# Patient Record
Sex: Male | Born: 2000 | Race: White | Hispanic: Yes | Marital: Single | Smoking: Never smoker
Health system: Southern US, Community
[De-identification: ages and names within clinical notes are randomized; demographics above are authoritative.]

## PROBLEM LIST (undated history)

## (undated) DIAGNOSIS — S022XXA Fracture of nasal bones, initial encounter for closed fracture: Secondary | ICD-10-CM

## (undated) HISTORY — PX: URETER REVISION: SHX493

## (undated) HISTORY — PX: TONSILLECTOMY: SHX5217

---
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
## 2022-01-14 DIAGNOSIS — Z419 Encounter for procedure for purposes other than remedying health state, unspecified: Secondary | ICD-10-CM | POA: Diagnosis not present

## 2022-02-13 DIAGNOSIS — Z419 Encounter for procedure for purposes other than remedying health state, unspecified: Secondary | ICD-10-CM | POA: Diagnosis not present

## 2022-03-16 DIAGNOSIS — Z419 Encounter for procedure for purposes other than remedying health state, unspecified: Secondary | ICD-10-CM | POA: Diagnosis not present

## 2022-04-15 DIAGNOSIS — Z419 Encounter for procedure for purposes other than remedying health state, unspecified: Secondary | ICD-10-CM | POA: Diagnosis not present

## 2022-05-16 DIAGNOSIS — Z419 Encounter for procedure for purposes other than remedying health state, unspecified: Secondary | ICD-10-CM | POA: Diagnosis not present

## 2022-06-16 DIAGNOSIS — Z419 Encounter for procedure for purposes other than remedying health state, unspecified: Secondary | ICD-10-CM | POA: Diagnosis not present

## 2022-07-16 DIAGNOSIS — Z419 Encounter for procedure for purposes other than remedying health state, unspecified: Secondary | ICD-10-CM | POA: Diagnosis not present

## 2022-08-16 DIAGNOSIS — Z419 Encounter for procedure for purposes other than remedying health state, unspecified: Secondary | ICD-10-CM | POA: Diagnosis not present

## 2022-09-15 DIAGNOSIS — Z419 Encounter for procedure for purposes other than remedying health state, unspecified: Secondary | ICD-10-CM | POA: Diagnosis not present

## 2022-10-16 DIAGNOSIS — Z419 Encounter for procedure for purposes other than remedying health state, unspecified: Secondary | ICD-10-CM | POA: Diagnosis not present

## 2022-11-16 DIAGNOSIS — Z419 Encounter for procedure for purposes other than remedying health state, unspecified: Secondary | ICD-10-CM | POA: Diagnosis not present

## 2022-12-15 DIAGNOSIS — Z419 Encounter for procedure for purposes other than remedying health state, unspecified: Secondary | ICD-10-CM | POA: Diagnosis not present

## 2023-01-02 IMAGING — CT CT MAXILLOFACIAL W/O CM
3 of 6 series · 16 of 47 positions shown, 19 images · non-contrast
Comparison: None.

CLINICAL DATA: Persistent headache after being struck in face with
soccer ball

EXAM:
CT HEAD WITHOUT CONTRAST
CT MAXILLOFACIAL WITHOUT CONTRAST
TECHNIQUE: Multidetector CT imaging of the head and maxillofacial structures
were performed using the standard protocol without intravenous
contrast. Multiplanar CT image reconstructions of the maxillofacial
structures were also generated.

[Series 3: maxilllofacial 2.0 hr40 3 · axial · 0.34mm/px · z∈[-266,-124]mm · 11 of 83 slices shown, 14 images]
[im 6/83  brain]
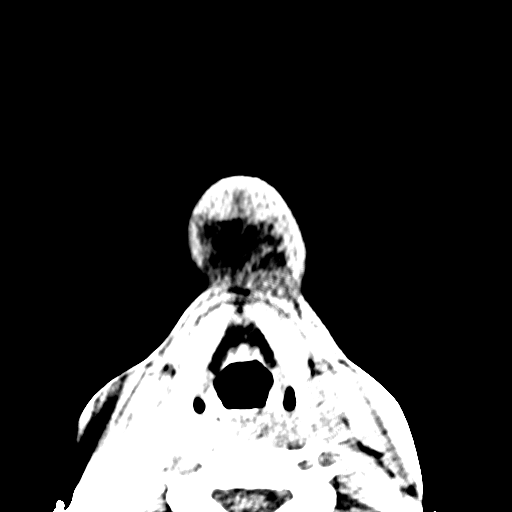
[im 6/83  bone]
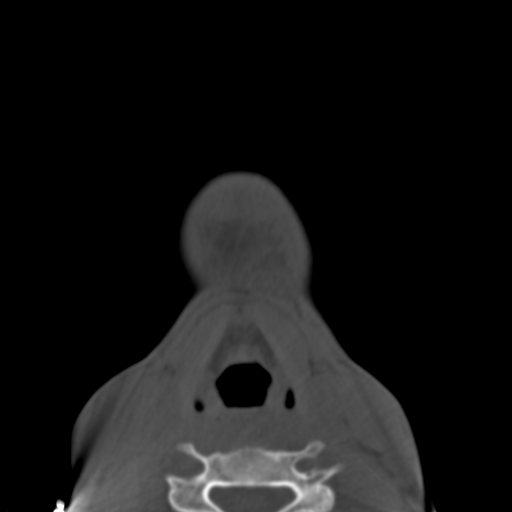
[im 12/83  bone]
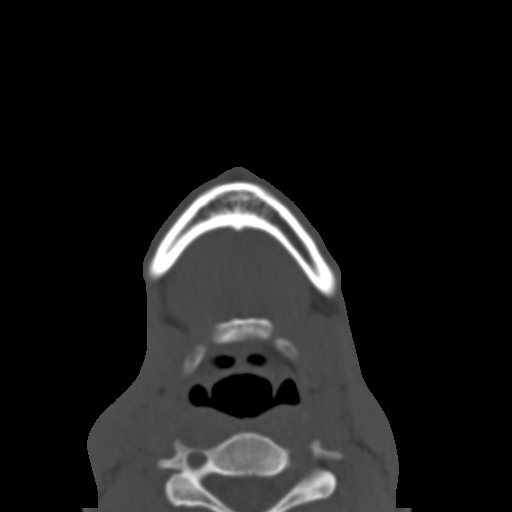
[im 18/83  bone]
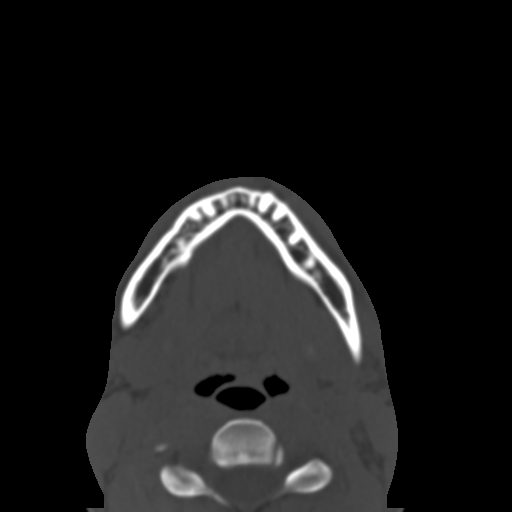
[im 30/83  bone]
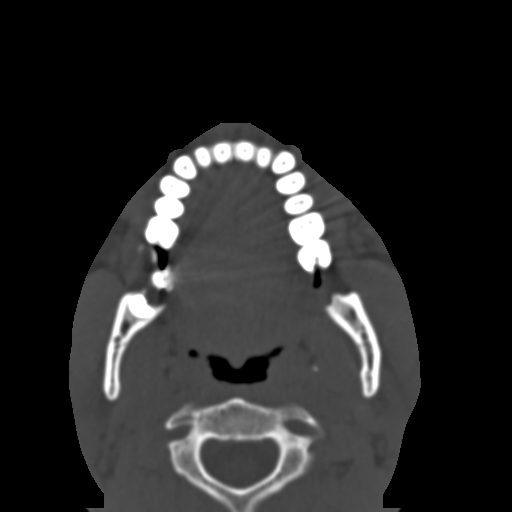
[im 36/83  brain]
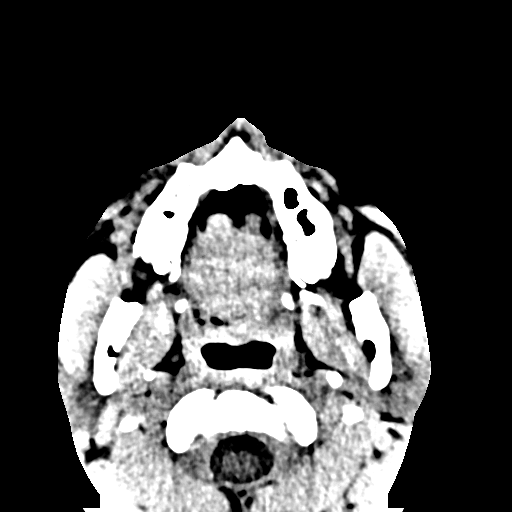
[im 36/83  bone]
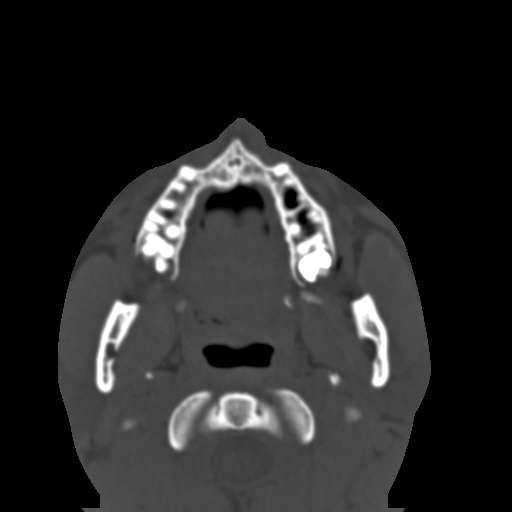
[im 42/83  bone]
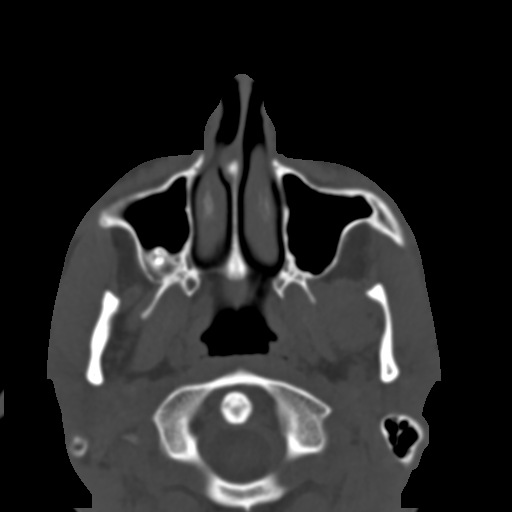
[im 47/83  bone]
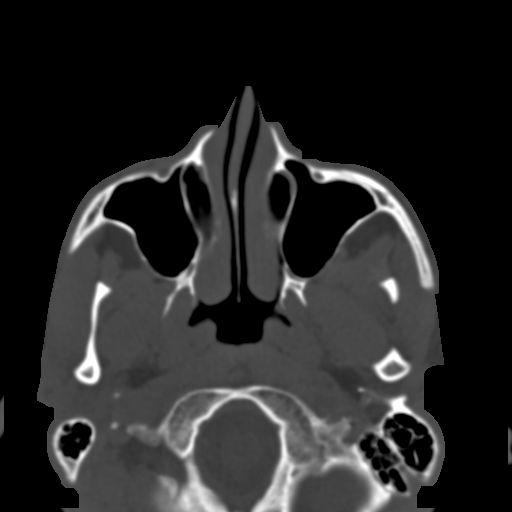
[im 53/83  bone]
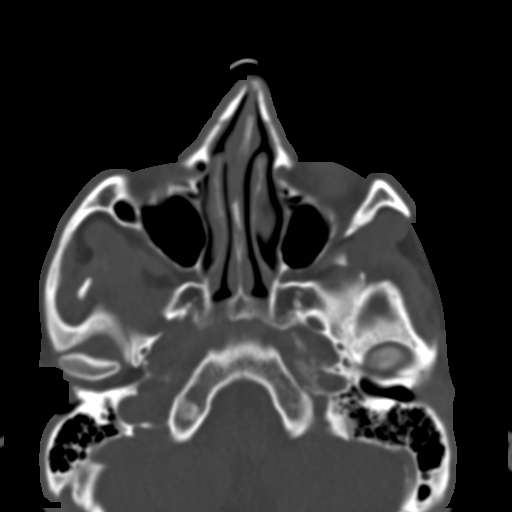
[im 65/83  brain]
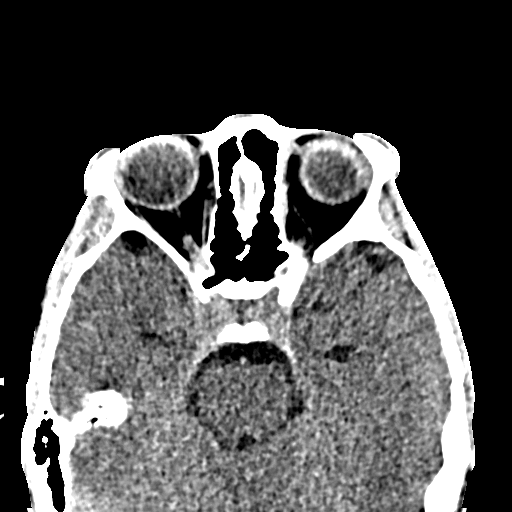
[im 65/83  bone]
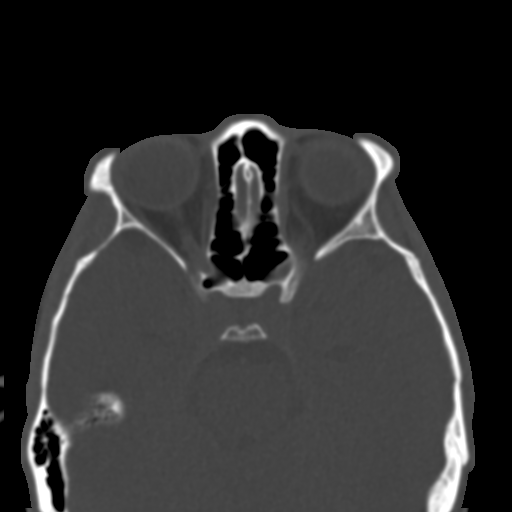
[im 71/83  bone]
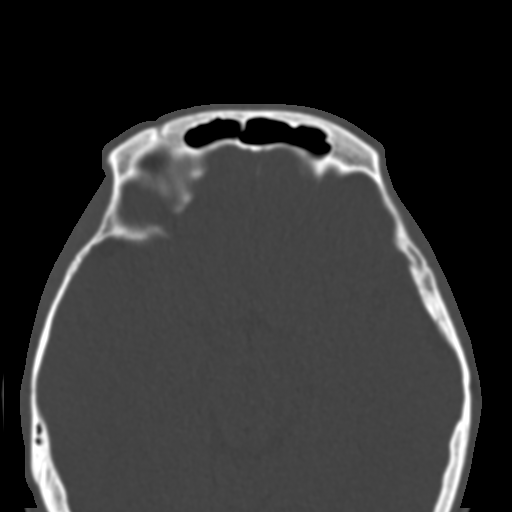
[im 77/83  bone]
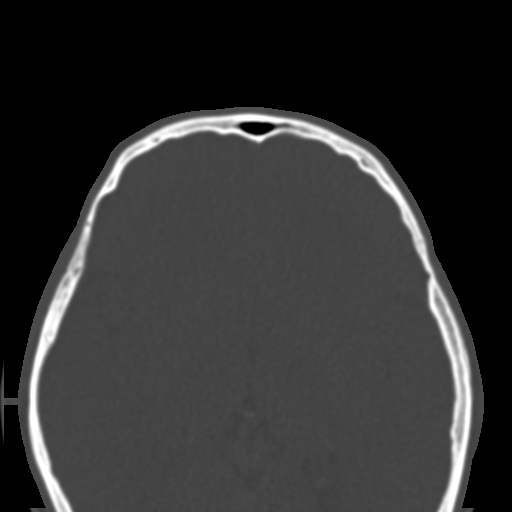

[Series 7: st cor · coronal · 0.38mm/px · 3 of 81 slices shown]
[im 21/81  bone]
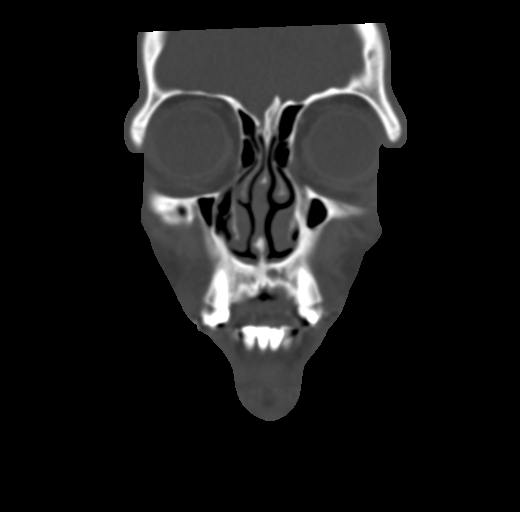
[im 41/81  bone]
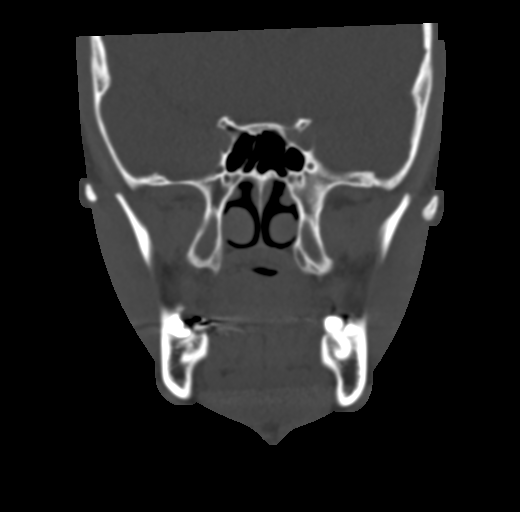
[im 61/81  bone]
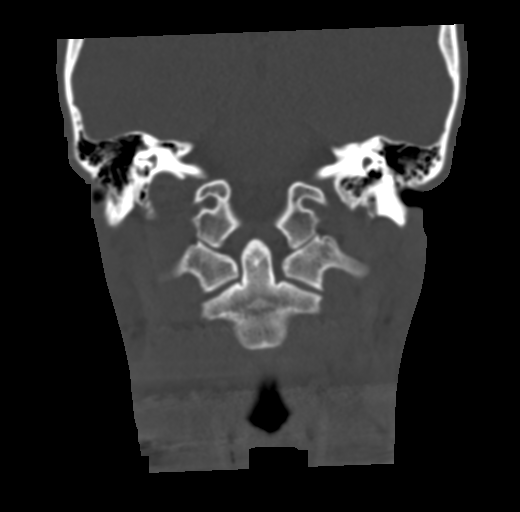

[Series 10: bone sag · sagittal · 0.36mm/px · 2 of 82 slices shown]
[im 28/82  bone]
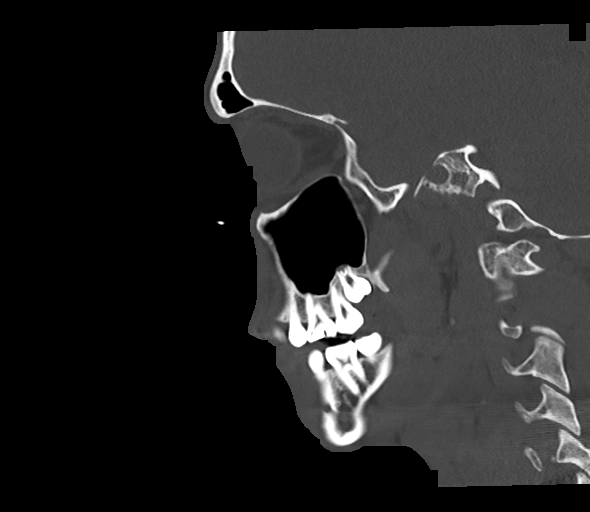
[im 55/82  bone]
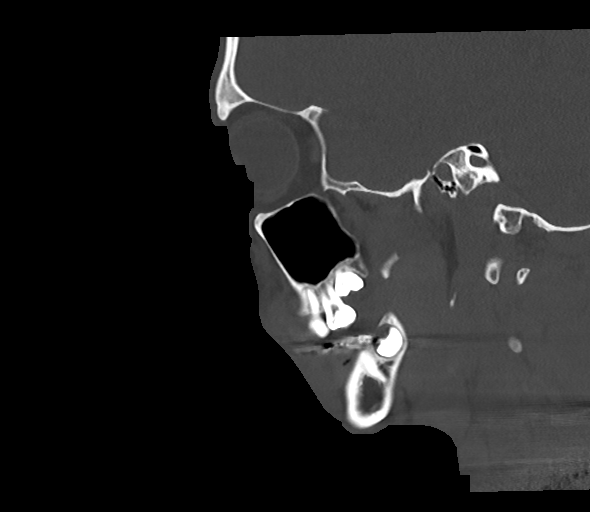

[16 of 47 positions shown; findings below may reference images not displayed]

FINDINGS: CT HEAD FINDINGS

Brain: No evidence of acute infarction, hemorrhage, hydrocephalus,
extra-axial collection or mass lesion/mass effect. Large posterior
fossa arachnoid cyst versus mega cisterna magna.

Vascular: No hyperdense vessel or unexpected calcification.

Skull: Normal. Negative for fracture or focal lesion.

Other: Streak artifact from dental hardware.

CT MAXILLOFACIAL FINDINGS

Osseous: No acute fracture or mandibular dislocation. Similar
irregularity of the nasal bone likely sequela prior trauma. No
destructive process.

Orbits: Negative. No traumatic or inflammatory finding.

Sinuses: Paranasal sinuses and mastoid air cells are predominantly
clear.

Soft tissues: Negative.
IMPRESSION: 1. No acute intracranial pathology.
2. No acute facial bone fracture.

## 2023-01-15 DIAGNOSIS — Z419 Encounter for procedure for purposes other than remedying health state, unspecified: Secondary | ICD-10-CM | POA: Diagnosis not present

## 2023-02-14 DIAGNOSIS — Z419 Encounter for procedure for purposes other than remedying health state, unspecified: Secondary | ICD-10-CM | POA: Diagnosis not present

## 2023-03-17 DIAGNOSIS — Z419 Encounter for procedure for purposes other than remedying health state, unspecified: Secondary | ICD-10-CM | POA: Diagnosis not present

## 2023-04-16 DIAGNOSIS — Z419 Encounter for procedure for purposes other than remedying health state, unspecified: Secondary | ICD-10-CM | POA: Diagnosis not present

## 2023-05-17 DIAGNOSIS — Z419 Encounter for procedure for purposes other than remedying health state, unspecified: Secondary | ICD-10-CM | POA: Diagnosis not present

## 2023-06-17 DIAGNOSIS — Z419 Encounter for procedure for purposes other than remedying health state, unspecified: Secondary | ICD-10-CM | POA: Diagnosis not present

## 2023-08-17 DIAGNOSIS — Z419 Encounter for procedure for purposes other than remedying health state, unspecified: Secondary | ICD-10-CM | POA: Diagnosis not present

## 2023-09-16 DIAGNOSIS — Z419 Encounter for procedure for purposes other than remedying health state, unspecified: Secondary | ICD-10-CM | POA: Diagnosis not present

## 2023-10-17 DIAGNOSIS — Z419 Encounter for procedure for purposes other than remedying health state, unspecified: Secondary | ICD-10-CM | POA: Diagnosis not present

## 2023-11-17 DIAGNOSIS — Z419 Encounter for procedure for purposes other than remedying health state, unspecified: Secondary | ICD-10-CM | POA: Diagnosis not present

## 2023-12-15 DIAGNOSIS — Z419 Encounter for procedure for purposes other than remedying health state, unspecified: Secondary | ICD-10-CM | POA: Diagnosis not present

## 2024-01-26 DIAGNOSIS — Z419 Encounter for procedure for purposes other than remedying health state, unspecified: Secondary | ICD-10-CM | POA: Diagnosis not present

## 2024-02-25 DIAGNOSIS — Z419 Encounter for procedure for purposes other than remedying health state, unspecified: Secondary | ICD-10-CM | POA: Diagnosis not present

## 2024-03-27 DIAGNOSIS — Z419 Encounter for procedure for purposes other than remedying health state, unspecified: Secondary | ICD-10-CM | POA: Diagnosis not present

## 2024-04-26 DIAGNOSIS — Z419 Encounter for procedure for purposes other than remedying health state, unspecified: Secondary | ICD-10-CM | POA: Diagnosis not present

## 2024-05-27 DIAGNOSIS — Z419 Encounter for procedure for purposes other than remedying health state, unspecified: Secondary | ICD-10-CM | POA: Diagnosis not present

## 2024-06-27 DIAGNOSIS — Z419 Encounter for procedure for purposes other than remedying health state, unspecified: Secondary | ICD-10-CM | POA: Diagnosis not present

## 2024-07-27 DIAGNOSIS — Z419 Encounter for procedure for purposes other than remedying health state, unspecified: Secondary | ICD-10-CM | POA: Diagnosis not present

## 2024-08-27 DIAGNOSIS — Z419 Encounter for procedure for purposes other than remedying health state, unspecified: Secondary | ICD-10-CM | POA: Diagnosis not present
